# Patient Record
Sex: Female | Born: 1990 | Race: White | Hispanic: No | Marital: Married | Smoking: Current every day smoker
Health system: Southern US, Community
[De-identification: ages and names within clinical notes are randomized; demographics above are authoritative.]

## PROBLEM LIST (undated history)

## (undated) ENCOUNTER — Inpatient Hospital Stay (HOSPITAL_COMMUNITY): Payer: Self-pay

## (undated) DIAGNOSIS — Z9889 Other specified postprocedural states: Secondary | ICD-10-CM

## (undated) HISTORY — PX: DILATION AND CURETTAGE OF UTERUS: SHX78

---
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
## 2009-03-15 ENCOUNTER — Inpatient Hospital Stay (HOSPITAL_COMMUNITY): Admission: AD | Admit: 2009-03-15 | Discharge: 2009-03-15 | Payer: Self-pay | Admitting: Obstetrics and Gynecology

## 2009-07-04 ENCOUNTER — Inpatient Hospital Stay (HOSPITAL_COMMUNITY): Admission: AD | Admit: 2009-07-04 | Discharge: 2009-07-05 | Payer: Self-pay | Admitting: Obstetrics and Gynecology

## 2009-09-17 ENCOUNTER — Inpatient Hospital Stay (HOSPITAL_COMMUNITY): Admission: AD | Admit: 2009-09-17 | Discharge: 2009-09-17 | Payer: Self-pay | Admitting: Obstetrics and Gynecology

## 2009-09-23 ENCOUNTER — Inpatient Hospital Stay (HOSPITAL_COMMUNITY): Admission: AD | Admit: 2009-09-23 | Discharge: 2009-09-23 | Payer: Self-pay | Admitting: Obstetrics and Gynecology

## 2009-09-26 ENCOUNTER — Inpatient Hospital Stay (HOSPITAL_COMMUNITY): Admission: RE | Admit: 2009-09-26 | Discharge: 2009-09-27 | Payer: Self-pay | Admitting: Obstetrics and Gynecology

## 2010-03-18 ENCOUNTER — Emergency Department (HOSPITAL_COMMUNITY): Admission: EM | Admit: 2010-03-18 | Discharge: 2010-03-18 | Payer: Self-pay | Admitting: Emergency Medicine

## 2010-07-22 NOTE — L&D Delivery Note (Signed)
Delivery note: This patient progressed to 4 cm dilatation and artificial rupture of membranes produced clear fluid. She steadfastly refused an epidural anesthetic. Consequently, she felt the baby was coming out and called for somebody to come to her room to deliver the baby. The cervix was found to continue to be 4 cm dilated.subsequently, she progressed to 7 cm dilatation and requested an epidural. During her IV infusion she progressed to 9 cm and was unable to stop her pushing efforts. Before, an epidural could be given, she delivered a living female infant 7 lbs. 10 oz. Apgars 9 at 1 and 9 at 5 minutes. The placenta was removed intact after cord blood was obtained the uterus was inspected and found to be free of any remaining products of conception.  Bright red blood was seen on the perineum prior to delivery of the vertex. Inspection of the perineum postpartum showed the perineum to be intact but there were multiple interlabial and  a periurethral laceration. Some of the labial lacerations were bleeding and required suturing with 3-0 Vicryl under local 1% Xylocaine block. Estimated blood loss 400 cc. Mother and baby were stable at the end of the delivery.

## 2010-08-31 ENCOUNTER — Other Ambulatory Visit (HOSPITAL_COMMUNITY): Payer: Self-pay | Admitting: Obstetrics and Gynecology

## 2010-08-31 DIAGNOSIS — Z3689 Encounter for other specified antenatal screening: Secondary | ICD-10-CM

## 2010-08-31 LAB — TYPE AND SCREEN: Antibody Screen: NEGATIVE

## 2010-08-31 LAB — RUBELLA ANTIBODY, IGM: Rubella: IMMUNE

## 2010-08-31 LAB — GC/CHLAMYDIA PROBE AMP, GENITAL: Chlamydia: NEGATIVE

## 2010-08-31 LAB — ABO/RH: RH Type: NEGATIVE

## 2010-09-03 ENCOUNTER — Ambulatory Visit (HOSPITAL_COMMUNITY)
Admission: RE | Admit: 2010-09-03 | Discharge: 2010-09-03 | Disposition: A | Discharge status: Home / Self Care | Payer: Medicaid Other | Source: Ambulatory Visit | Attending: Obstetrics and Gynecology | Admitting: Obstetrics and Gynecology

## 2010-09-03 ENCOUNTER — Encounter (HOSPITAL_COMMUNITY): Payer: Self-pay

## 2010-09-03 DIAGNOSIS — O99330 Smoking (tobacco) complicating pregnancy, unspecified trimester: Secondary | ICD-10-CM | POA: Insufficient documentation

## 2010-09-03 DIAGNOSIS — Z363 Encounter for antenatal screening for malformations: Secondary | ICD-10-CM | POA: Insufficient documentation

## 2010-09-03 DIAGNOSIS — Z3689 Encounter for other specified antenatal screening: Secondary | ICD-10-CM

## 2010-09-03 DIAGNOSIS — Z8751 Personal history of pre-term labor: Secondary | ICD-10-CM | POA: Insufficient documentation

## 2010-09-03 DIAGNOSIS — Z1389 Encounter for screening for other disorder: Secondary | ICD-10-CM | POA: Insufficient documentation

## 2010-09-03 DIAGNOSIS — O358XX0 Maternal care for other (suspected) fetal abnormality and damage, not applicable or unspecified: Secondary | ICD-10-CM | POA: Insufficient documentation

## 2010-10-05 LAB — POCT PREGNANCY, URINE: Preg Test, Ur: NEGATIVE

## 2010-10-15 LAB — CBC
HCT: 41.2 % (ref 36.0–46.0)
Hemoglobin: 14 g/dL (ref 12.0–15.0)
MCHC: 34 g/dL (ref 30.0–36.0)
MCHC: 34.7 g/dL (ref 30.0–36.0)
MCV: 101.2 fL — ABNORMAL HIGH (ref 78.0–100.0)
MCV: 101.5 fL — ABNORMAL HIGH (ref 78.0–100.0)
Platelets: 177 10*3/uL (ref 150–400)
Platelets: 202 10*3/uL (ref 150–400)
RBC: 3.39 MIL/uL — ABNORMAL LOW (ref 3.87–5.11)
RDW: 12.7 % (ref 11.5–15.5)
RDW: 13.2 % (ref 11.5–15.5)

## 2010-10-15 LAB — WET PREP, GENITAL: Yeast Wet Prep HPF POC: NONE SEEN

## 2010-10-23 LAB — URINALYSIS, ROUTINE W REFLEX MICROSCOPIC
Glucose, UA: NEGATIVE mg/dL
Ketones, ur: NEGATIVE mg/dL
Nitrite: NEGATIVE
Protein, ur: NEGATIVE mg/dL
Urobilinogen, UA: 0.2 mg/dL (ref 0.0–1.0)

## 2010-10-23 LAB — URINE CULTURE
Colony Count: NO GROWTH
Culture: NO GROWTH

## 2010-10-23 LAB — URINE MICROSCOPIC-ADD ON

## 2010-10-27 LAB — RH IMMUNE GLOBULIN WORKUP (NOT WOMEN'S HOSP)
ABO/RH(D): A NEG
Antibody Screen: NEGATIVE

## 2010-11-05 LAB — TYPE AND SCREEN
ABO/RH(D): A NEG
Antibody Screen: POSITIVE
DAT, IgG: NEGATIVE

## 2010-11-05 LAB — URINALYSIS, ROUTINE W REFLEX MICROSCOPIC
Glucose, UA: NEGATIVE mg/dL
Glucose, UA: NEGATIVE mg/dL
Hgb urine dipstick: NEGATIVE
Ketones, ur: NEGATIVE mg/dL
Nitrite: NEGATIVE
Nitrite: NEGATIVE
Nitrite: NEGATIVE
Specific Gravity, Urine: >1.03 — ABNORMAL HIGH (ref 1.005–1.030)
Specific Gravity, Urine: 1.031 — ABNORMAL HIGH (ref 1.005–1.030)
Urobilinogen, UA: 0.2 mg/dL (ref 0.0–1.0)
pH: 5.5 (ref 5.0–8.0)
pH: 6.5 (ref 5.0–8.0)
pH: 5.5 (ref 5.0–8.0)

## 2010-11-05 LAB — DIFFERENTIAL
Basophils Absolute: 0.2 10*3/uL — ABNORMAL HIGH (ref 0.0–0.1)
Basophils Relative: 1 % (ref 0–1)
Eosinophils Absolute: 0 10*3/uL (ref 0.0–0.7)
Monocytes Relative: 4 % (ref 3–12)
Neutro Abs: 21.1 10*3/uL — ABNORMAL HIGH (ref 1.7–7.7)
Neutrophils Relative %: 90 % — ABNORMAL HIGH (ref 43–77)

## 2010-11-05 LAB — CBC
HCT: 26.1 % — ABNORMAL LOW (ref 36.0–46.0)
HCT: 37.3 % (ref 36.0–46.0)
Hemoglobin: 12.7 g/dL (ref 12.0–15.0)
MCHC: 34.1 g/dL (ref 30.0–36.0)
MCHC: 34.4 g/dL (ref 30.0–36.0)
MCHC: 34 g/dL (ref 30.0–36.0)
MCV: 100.5 fL — ABNORMAL HIGH (ref 78.0–100.0)
Platelets: 199 10*3/uL (ref 150–400)
Platelets: 221 10*3/uL (ref 150–400)
Platelets: 251 10*3/uL (ref 150–400)
RBC: 2.59 MIL/uL — ABNORMAL LOW (ref 3.87–5.11)
RBC: 3.93 MIL/uL (ref 3.87–5.11)
RDW: 12.9 % (ref 11.5–15.5)
RDW: 13 % (ref 11.5–15.5)
RDW: 13.1 % (ref 11.5–15.5)
WBC: 21.7 10*3/uL — ABNORMAL HIGH (ref 4.0–10.5)

## 2010-11-05 LAB — URINE MICROSCOPIC-ADD ON

## 2010-11-05 LAB — RAPID URINE DRUG SCREEN, HOSP PERFORMED
Amphetamines: NOT DETECTED
Barbiturates: NOT DETECTED
Cocaine: NOT DETECTED
Opiates: POSITIVE — AB

## 2010-11-05 LAB — WET PREP, GENITAL: Clue Cells Wet Prep HPF POC: NONE SEEN

## 2010-11-05 LAB — POCT PREGNANCY, URINE: Preg Test, Ur: POSITIVE

## 2010-11-05 LAB — RH IMMUNE GLOB WKUP(>/=20WKS)(NOT WOMEN'S HOSP)

## 2010-11-05 LAB — ABO/RH: ABO/RH(D): A NEG

## 2010-11-05 LAB — RUBELLA SCREEN: Rubella: 66 IU/mL — ABNORMAL HIGH

## 2010-11-05 LAB — SAMPLE TO BLOOD BANK

## 2010-11-05 LAB — POCT I-STAT, CHEM 8
Calcium, Ion: 1.13 mmol/L (ref 1.12–1.32)
Chloride: 109 mEq/L (ref 96–112)
Glucose, Bld: 96 mg/dL (ref 70–99)
HCT: 40 % (ref 36.0–46.0)
TCO2: 18 mmol/L (ref 0–100)

## 2010-11-28 LAB — STREP B DNA PROBE: GBS: NEGATIVE

## 2010-12-04 NOTE — Op Note (Signed)
[name], Jacqueline Odom NO.:  [id number]   MEDICAL RECORD NO.:  [id number]          PATIENT TYPE:  AMB   LOCATION:  SDC                           FACILITY:  [facility]   PHYSICIAN:  Gerald Leitz, MD          DATE OF BIRTH:  [birth year]   DATE OF PROCEDURE:  08/19/2008  DATE OF DISCHARGE:                               OPERATIVE REPORT   PREOPERATIVE DIAGNOSIS:  Retained products of conception.   POSTOPERATIVE DIAGNOSIS:  Retained products of conception.   PROCEDURE:  Dilation and curettage under ultrasound guidance.   SURGEON:  Gerald Leitz, MD   ASSISTANT:  None.   ANESTHESIA:  MAC.   FINDINGS:  Retained products of conception.   SPECIMEN:  Products of conception.   DISPOSITION:  Specimen to pathology.   ESTIMATED BLOOD LOSS:  400 mL.   COMPLICATIONS:  None.   PROCEDURE:  Informed consent was obtained.  The patient was taken to the  operating room, where she was placed under MAC anesthesia.  She was  placed in the dorsal lithotomy position and prepped and draped in the  usual sterile fashion.  The speculum was placed into the vaginal vault.  The anterior lip of the cervix was grasped with a single-tooth  tenaculum.  The uterus sounded to approximately 9.5 cm.  Ultrasound was  performed.  The patient was noted to have several areas of Doppler flow.  Once endometrium sharp curettage was performed, multiple products were  obtained.  However, gritty texture was noted.  At this point, a suction  curettage was performed using a 12-mm curette.  Significant amount of  products were removed via suction curettage.  Sharp curettage was  performed, action  suction, and then a gritty texture was noted at this  point all around.  The ultrasound did not demonstrate any remaining  products of conception.  The patient was noted to have some moderate  bleeding and a 1000 mcg of  Cytotec was placed into the rectum.  She was also given Methergine 0.2  mg IM and bleeding subsided.   All instruments were removed from the  vagina.  The patient was taken to the operating room, awake, and in  stable condition.  Sponge, lap, and needle counts were correct x2.      Gerald Leitz, MD  Electronically Signed     TC/MEDQ  D:  08/19/2008  T:  08/20/2008  Job:  [id number]

## 2010-12-07 NOTE — Discharge Summary (Signed)
Jacqueline Odom, Jacqueline Odom                ACCOUNT NO.:  [id number]   MEDICAL RECORD NO.:  [id number]          PATIENT TYPE:  INP   LOCATION:  9303                          FACILITY:  [facility]   PHYSICIAN:  Gerald Leitz, MD          DATE OF BIRTH:  07/26/1990   DATE OF ADMISSION:  08/16/2008  DATE OF DISCHARGE:  08/17/2008                               DISCHARGE SUMMARY   ADMISSION DIAGNOSES:  1. A 26-week intrauterine pregnancy.  2. Active labor.  3. Trichomonas  4. No prenatal care.   DISCHARGE DIAGNOSES:  1. A 26-week intrauterine pregnancy.  2. Active labor.  3. Trichomonas  4. No prenatal care.  5. Status post spontaneous vaginal delivery.  6. Postpartum hemorrhage.  7. Uterine atony.   BRIEF HOSPITAL COURSE:  The patient was seen at [facility] emergency  department complaining of abdominal pain and found to be pregnant.  She  was transferred to [facility] where I asked them to hear.  Upon  arrival, she was complaining of contractions, injury evaluation.  She  became completely dilated and delivered approximately 26 week estimated  gestational age infant in maternity admissions unit with an hydramnios  and brain abnormality that was incompatible with a live.  Neonatologist  came to delivery who was present shortly after delivery.  The patient's  placenta was delivered.  In returning admissions unit, she was noted to  have uterine atony.  She was taken to on labor and delivery room where  she received 1000 mcg of Cytotec per rectum, Methergine, 40 units of  Pitocin, and uterine massage, bleeding decreased significantly.  Her  hemoglobin on admission was 12.7 and on discharge after delivery was  9.1.  She was discharged home on Flagyl for Trichomonas as well as  Methergine.  She was to follow up in my office in 2 week.   Condition at discharge stable and improved.      Gerald Leitz, MD  Electronically Signed     TC/MEDQ  D:  09/27/2008  T:  09/28/2008  Job:  [id number]

## 2011-01-28 ENCOUNTER — Inpatient Hospital Stay (HOSPITAL_COMMUNITY)
Admission: AD | Admit: 2011-01-28 | Discharge: 2011-01-30 | DRG: 775 | Disposition: A | Discharge status: Home / Self Care | Payer: Medicaid Other | Source: Ambulatory Visit | Attending: Obstetrics and Gynecology | Admitting: Obstetrics and Gynecology

## 2011-01-28 ENCOUNTER — Other Ambulatory Visit: Payer: Self-pay | Admitting: Obstetrics and Gynecology

## 2011-01-28 ENCOUNTER — Encounter (HOSPITAL_COMMUNITY): Payer: Self-pay | Admitting: *Deleted

## 2011-01-28 DIAGNOSIS — O365990 Maternal care for other known or suspected poor fetal growth, unspecified trimester, not applicable or unspecified: Secondary | ICD-10-CM | POA: Diagnosis present

## 2011-01-28 DIAGNOSIS — Z9889 Other specified postprocedural states: Secondary | ICD-10-CM

## 2011-01-28 HISTORY — DX: Other specified postprocedural states: Z98.890

## 2011-01-28 LAB — CBC
HCT: 38.5 % (ref 36.0–46.0)
MCV: 95.1 fL (ref 78.0–100.0)
Platelets: 218 10*3/uL (ref 150–400)
RBC: 4.05 MIL/uL (ref 3.87–5.11)
WBC: 11.4 10*3/uL — ABNORMAL HIGH (ref 4.0–10.5)

## 2011-01-28 MED ORDER — OXYTOCIN 20 UNITS IN LACTATED RINGERS INFUSION - SIMPLE
1.0000 m[IU]/min | INTRAVENOUS | Status: DC
  Administered 2011-01-28: 11 m[IU]/min via INTRAVENOUS
  Administered 2011-01-28: 0.333 m[IU]/min via INTRAVENOUS

## 2011-01-28 MED ORDER — LACTATED RINGERS IV SOLN: 500.0000 mL | Freq: Once | INTRAVENOUS | Status: DC

## 2011-01-28 MED ORDER — PHENYLEPHRINE 40 MCG/ML (10ML) SYRINGE FOR IV PUSH (FOR BLOOD PRESSURE SUPPORT)
PREFILLED_SYRINGE | INTRAVENOUS | Status: AC
  Filled 2011-01-28: qty 5

## 2011-01-28 MED ORDER — PHENYLEPHRINE 40 MCG/ML (10ML) SYRINGE FOR IV PUSH (FOR BLOOD PRESSURE SUPPORT)
80.0000 ug | PREFILLED_SYRINGE | INTRAVENOUS | Status: DC | PRN
  Filled 2011-01-28: qty 5

## 2011-01-28 MED ORDER — LACTATED RINGERS IV SOLN: INTRAVENOUS | Status: DC

## 2011-01-28 MED ORDER — EPHEDRINE 5 MG/ML INJ
INTRAVENOUS | Status: AC
  Filled 2011-01-28: qty 4

## 2011-01-28 MED ORDER — EPHEDRINE 5 MG/ML INJ
10.0000 mg | INTRAVENOUS | Status: DC | PRN
  Filled 2011-01-28: qty 4

## 2011-01-28 MED ORDER — FLEET ENEMA 7-19 GM/118ML RE ENEM: 1.0000 | ENEMA | RECTAL | Status: DC | PRN

## 2011-01-28 MED ORDER — OXYTOCIN 20 UNITS IN LACTATED RINGERS INFUSION - SIMPLE
125.0000 mL/h | Freq: Once | INTRAVENOUS | Status: DC
  Filled 2011-01-28 (×2): qty 1000

## 2011-01-28 MED ORDER — LACTATED RINGERS IV SOLN
500.0000 mL | Freq: Once | INTRAVENOUS | Status: AC | PRN
Start: 2011-01-28 — End: 2011-01-28

## 2011-01-28 MED ORDER — ONDANSETRON HCL 4 MG/2ML IJ SOLN: 4.0000 mg | Freq: Four times a day (QID) | INTRAMUSCULAR | Status: DC | PRN

## 2011-01-28 MED ORDER — ACETAMINOPHEN 325 MG PO TABS: 650.0000 mg | ORAL_TABLET | ORAL | Status: DC | PRN

## 2011-01-28 MED ORDER — TERBUTALINE SULFATE 1 MG/ML IJ SOLN: 0.2500 mg | Freq: Once | INTRAMUSCULAR | Status: AC | PRN

## 2011-01-28 MED ORDER — FENTANYL 2.5 MCG/ML BUPIVACAINE 1/10 % EPIDURAL INFUSION (WH - ANES): 2.0000 mL/h | INTRAMUSCULAR | Status: DC

## 2011-01-28 MED ORDER — IBUPROFEN 600 MG PO TABS
600.0000 mg | ORAL_TABLET | Freq: Four times a day (QID) | ORAL | Status: DC | PRN
  Administered 2011-01-29: 600 mg via ORAL
  Filled 2011-01-28 (×2): qty 1

## 2011-01-28 MED ORDER — FENTANYL 2.5 MCG/ML BUPIVACAINE 1/10 % EPIDURAL INFUSION (WH - ANES)
INTRAMUSCULAR | Status: AC
  Filled 2011-01-28: qty 60

## 2011-01-28 MED ORDER — DIPHENHYDRAMINE HCL 50 MG/ML IJ SOLN: 12.5000 mg | INTRAMUSCULAR | Status: DC | PRN

## 2011-01-28 MED ORDER — TERBUTALINE SULFATE 1 MG/ML IJ SOLN: 0.2500 mg | Freq: Once | INTRAMUSCULAR | Status: DC | PRN

## 2011-01-28 MED ORDER — CITRIC ACID-SODIUM CITRATE 334-500 MG/5ML PO SOLN: 30.0000 mL | ORAL | Status: DC | PRN

## 2011-01-28 MED ORDER — LIDOCAINE HCL (PF) 1 % IJ SOLN
30.0000 mL | Freq: Once | INTRAMUSCULAR | Status: AC | PRN
  Administered 2011-01-28: 30 mL via SUBCUTANEOUS
  Filled 2011-01-28: qty 30

## 2011-01-28 NOTE — Progress Notes (Signed)
Jacqueline Odom is a 20 y.o. G3P1101 at [redacted]w[redacted]d by ultrasound admitted for induction of labor due to Elective at term.  Subjective:   Objective: BP 154/73  Pulse 78  Temp(Src) 98.5 F (36.9 C) (Oral)  Resp 18  Ht 5\' 5"  (1.651 m)  Wt 77.111 kg (170 lb)  BMI 28.29 kg/m2  LMP 04/22/2010  Breastfeeding? Unknown      FHT:  FHR: 140 bpm, variability: moderate,  accelerations:  Present,  decelerations:  Absent UC:   regular, every 3 minutes SVE:   Dilation: 4 Effacement (%): 70 Station: -2 Exam by:: Kagan Mutchler AROM clear fluid at approximately 6:30 PM  Labs: Lab Results  Component Value Date   WBC 11.4* 01/28/2011   HGB 13.5 01/28/2011   HCT 38.5 01/28/2011   MCV 95.1 01/28/2011   PLT 218 01/28/2011    Assessment / Plan: Induction of labor due to term with favorable cervix,  progressing well on pitocin  Labor: Progressing normally Preeclampsia:  no signs or symptoms of toxicity Fetal Wellbeing:  Category I Pain Control:  Labor support without medications I/D:  n/a Anticipated MOD:  NSVD  Dashan Chizmar F 01/28/2011, 7:15 PM

## 2011-01-28 NOTE — H&P (Signed)
History and physical exam on Jacqueline Odom. This is a 20 year old white female para 79 gravida 3 EDC 02/03/2011 based on an ultrasound at 10 weeks 3 days on 07/11/2010 who is admitted for induction of labor because of 39+ weeks gestation and possible small for gestational age infant . Laboratory data blood group and type A- negative antibody rubella immune RPR nonreactive urine culture no growth hepatitis B surface antigen negative HIV negative GC and Chlamydia negative quad screen negative one-hour Glucola 90 1 repeat HIV and RPR nonreactive group B strep negative RhoGAM given at 28 weeks  Vaginal ultrasound 06/27/2010 average gestational age [redacted] weeks 2 days [facility] 02/04/2011 followup ultrasound showed normal anatomy the patient was placed on bowel loop in 250 mg IM weekly after her first prenatal visit at 17 weeks prenatal course was unremarkable except the patient did smoke one to 2 cigarettes a day and she was begun on nonstress test 01/15/2011 because of possible small for dates baby and her smoking history the results of the ultrasound are not available.  Past medical history: Allergies: Codeine sulfate unsure of the type of reaction penicillin unsure of the type of reaction  Illnesses asthma vaginal trichomoniasis  Operations: D&C 2010  Family history: Sister: glaucoma, and brother : in hypertension . Maternal grandfather coronary artery disease great and diabetes  Obstetric history: 07/22/2008 vaginal delivery 1 pounds 3 and infant with retained placenta requiring a D&C the baby died shortly after her  10/25/2009 7 lbs. 0 oz. Female infant at 29 weeks, precipitous delivery  Social history: Denies current alcohol use but has a history of alcoholism denies illicit substance abuse. Currently uses tobacco probably no more than one half pack per day.  Physical exam: Blood pressure 141/64 temperature 98.6, pulse 70, respirations 18 Well-developed well-nourished white female in no distress Head eyes  nose and throat normal Lungs: Clear to a. Heart: Normal sinus rhythm, no murmurs. Abdomen: Fundal height on 01/15/2011 34 cm. Fetal heart tones normal, no decelerations, good accelerations. Cervix: 3 cm 40% effaced vertex -3 station  Impression: Intrauterine pregnancy 39 weeks possible SGA infant.  Plan: Begin IV Pitocin and observe for progress.

## 2011-01-28 NOTE — Plan of Care (Signed)
Problem: Consults Goal: Birthing Suites Patient Information Press F2 to bring up selections list  Outcome: Completed/Met Date Met:  01/28/11  Pt 37-[redacted] weeks EGA     

## 2011-01-29 LAB — CBC
MCH: 33 pg (ref 26.0–34.0)
MCV: 95.8 fL (ref 78.0–100.0)
Platelets: 201 10*3/uL (ref 150–400)
RBC: 3.85 MIL/uL — ABNORMAL LOW (ref 3.87–5.11)
RDW: 13.3 % (ref 11.5–15.5)
WBC: 14.1 10*3/uL — ABNORMAL HIGH (ref 4.0–10.5)

## 2011-01-29 LAB — DIFFERENTIAL
Basophils Absolute: 0 10*3/uL (ref 0.0–0.1)
Lymphs Abs: 2 10*3/uL (ref 0.7–4.0)
Monocytes Absolute: 1.3 10*3/uL — ABNORMAL HIGH (ref 0.1–1.0)
Monocytes Relative: 9 % (ref 3–12)
Neutrophils Relative %: 76 % (ref 43–77)

## 2011-01-29 MED ORDER — IBUPROFEN 600 MG PO TABS
600.0000 mg | ORAL_TABLET | Freq: Four times a day (QID) | ORAL | Status: DC
  Administered 2011-01-29 – 2011-01-30 (×4): 600 mg via ORAL
  Filled 2011-01-29 (×4): qty 1

## 2011-01-29 MED ORDER — OXYTOCIN 20 UNITS IN LACTATED RINGERS INFUSION - SIMPLE: 125.0000 mL/h | INTRAVENOUS | Status: DC

## 2011-01-29 MED ORDER — OXYCODONE-ACETAMINOPHEN 5-325 MG PO TABS
1.0000 | ORAL_TABLET | ORAL | Status: DC | PRN
  Administered 2011-01-29 – 2011-01-30 (×3): 1 via ORAL
  Filled 2011-01-29 (×3): qty 1

## 2011-01-29 MED ORDER — RHO D IMMUNE GLOBULIN 1500 UNIT/2ML IJ SOLN
300.0000 ug | Freq: Once | INTRAMUSCULAR | Status: AC
  Administered 2011-01-29: 300 ug via INTRAMUSCULAR

## 2011-01-29 MED ORDER — PRENATAL PLUS 27-1 MG PO TABS
1.0000 | ORAL_TABLET | Freq: Every day | ORAL | Status: DC
  Administered 2011-01-30: 1 via ORAL
  Filled 2011-01-29: qty 1

## 2011-01-29 MED ORDER — LANOLIN HYDROUS EX OINT: TOPICAL_OINTMENT | CUTANEOUS | Status: DC | PRN

## 2011-01-29 MED ORDER — DIPHENHYDRAMINE HCL 25 MG PO CAPS: 25.0000 mg | ORAL_CAPSULE | Freq: Four times a day (QID) | ORAL | Status: DC | PRN

## 2011-01-29 MED ORDER — SENNOSIDES-DOCUSATE SODIUM 8.6-50 MG PO TABS
1.0000 | ORAL_TABLET | Freq: Every day | ORAL | Status: DC
  Administered 2011-01-29: 1 via ORAL

## 2011-01-29 MED ORDER — PRENATAL PLUS 27-1 MG PO TABS: 1.0000 | ORAL_TABLET | Freq: Every day | ORAL | Status: DC

## 2011-01-29 MED ORDER — SIMETHICONE 80 MG PO CHEW: 80.0000 mg | CHEWABLE_TABLET | ORAL | Status: DC | PRN

## 2011-01-29 MED ORDER — ZOLPIDEM TARTRATE 5 MG PO TABS: 5.0000 mg | ORAL_TABLET | Freq: Every evening | ORAL | Status: DC | PRN

## 2011-01-29 MED ORDER — RHO D IMMUNE GLOBULIN 1500 UNIT/2ML IJ SOLN: 300.0000 ug | Freq: Once | INTRAMUSCULAR | Status: AC

## 2011-01-29 MED ORDER — WITCH HAZEL-GLYCERIN EX PADS: MEDICATED_PAD | CUTANEOUS | Status: DC | PRN

## 2011-01-29 MED ORDER — ONDANSETRON HCL 4 MG PO TABS: 4.0000 mg | ORAL_TABLET | ORAL | Status: DC | PRN

## 2011-01-29 MED ORDER — BENZOCAINE-MENTHOL 20-0.5 % EX AERO: 1.0000 "application " | INHALATION_SPRAY | CUTANEOUS | Status: DC | PRN

## 2011-01-29 MED ORDER — ONDANSETRON HCL 4 MG/2ML IJ SOLN: 4.0000 mg | INTRAMUSCULAR | Status: DC | PRN

## 2011-01-29 MED ORDER — TETANUS-DIPHTH-ACELL PERTUSSIS 5-2.5-18.5 LF-MCG/0.5 IM SUSP
0.5000 mL | Freq: Once | INTRAMUSCULAR | Status: AC
  Administered 2011-01-30: 0.5 mL via INTRAMUSCULAR
  Filled 2011-01-29: qty 0.5

## 2011-01-29 MED ORDER — MEASLES, MUMPS & RUBELLA VAC ~~LOC~~ INJ
0.5000 mL | INJECTION | Freq: Once | SUBCUTANEOUS | Status: DC
  Filled 2011-01-29: qty 0.5

## 2011-01-29 NOTE — Progress Notes (Signed)
  PP#1 Afebrile,BP normal, no complaints.

## 2011-01-29 NOTE — Progress Notes (Signed)
UR Chart Review completed.  

## 2011-01-30 ENCOUNTER — Encounter (HOSPITAL_COMMUNITY): Payer: Self-pay | Admitting: Obstetrics and Gynecology

## 2011-01-30 DIAGNOSIS — Z9889 Other specified postprocedural states: Secondary | ICD-10-CM

## 2011-01-30 HISTORY — DX: Other specified postprocedural states: Z98.890

## 2011-01-30 LAB — RH IG WORKUP (INCLUDES ABO/RH)
Gestational Age(Wks): 40
Unit division: 0

## 2011-01-30 MED ORDER — LANOLIN HYDROUS EX OINT: 1.0000 "application " | TOPICAL_OINTMENT | CUTANEOUS | Status: DC | PRN

## 2011-01-30 MED ORDER — IBUPROFEN 600 MG PO TABS: 600.0000 mg | ORAL_TABLET | Freq: Four times a day (QID) | ORAL | Status: AC

## 2011-01-30 MED ORDER — BENZOCAINE-MENTHOL 20-0.5 % EX AERO: 1.0000 "application " | INHALATION_SPRAY | CUTANEOUS | Status: DC | PRN

## 2011-01-30 NOTE — Progress Notes (Signed)
  Afebrile, no problems for discharge.

## 2011-01-30 NOTE — Discharge Summary (Signed)
  Obstetric Discharge Summary Reason for Admission: induction of labor Intrapartum Procedures: spontaneous vaginal delivery Postpartum Procedures: none and Rho(D) Ig Complications-Operative and Postpartum: labial and periurethral laceration  Hemoglobin  Date Value Range Status  01/29/2011 12.7  12.0-15.0 (g/dL) Final     HCT  Date Value Range Status  01/29/2011 36.9  36.0-46.0 (%) Final    Discharge Diagnoses: Term Pregnancy-delivered  Discharge Information: Date: 01/30/2011 Activity: pelvic rest Diet: routine Medications: motrin 600 mg # 30  1 po q 6h prn pain  Jacqueline Odom, Jacqueline Odom  Home Medication Instructions [id number]   Printed on:01/30/11 0837  Medication Information                    prenatal vitamin w/FE, FA (PRENATAL 1 + 1) 27-1 MG TABS Take 1 tablet by mouth daily.               Condition: stable Instructions: refer to practice specific booklet Discharge to: home Follow-up Information    Follow up with Marco Raper F. Make an appointment in 6 weeks.   Contact information:   [employer] [address] [place], [address] [place] [address] [phone]          Newborn Data: Live born  Information for the patient's newborn:  KAMRI, GOTSCH [[id number]]  female  Home with mother.  Rheba Diamond F 01/30/2011, 8:37 AM

## 2011-05-12 IMAGING — US US OB DETAIL+14 WK
2 series · 12 of 28 positions shown · non-contrast
Comparison: none

[Series 1: us ob detail +14 wk · 10 of 66 slices shown (1 of 2)]
[im 3/66]
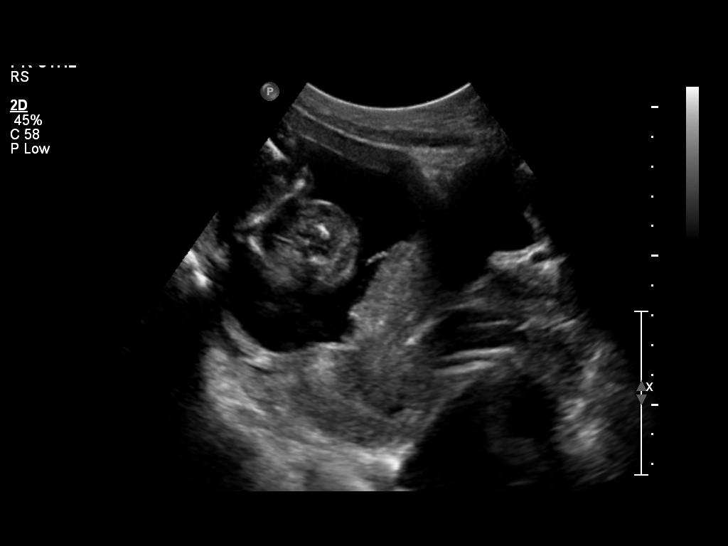
[im 9/66]
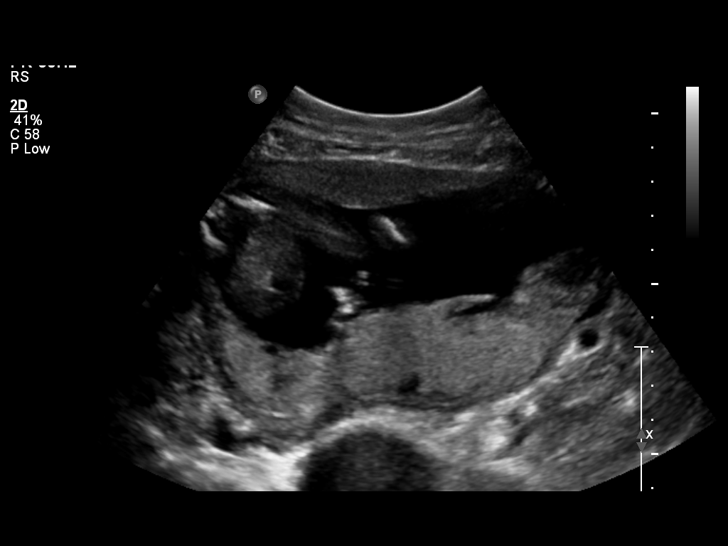
[im 15/66]
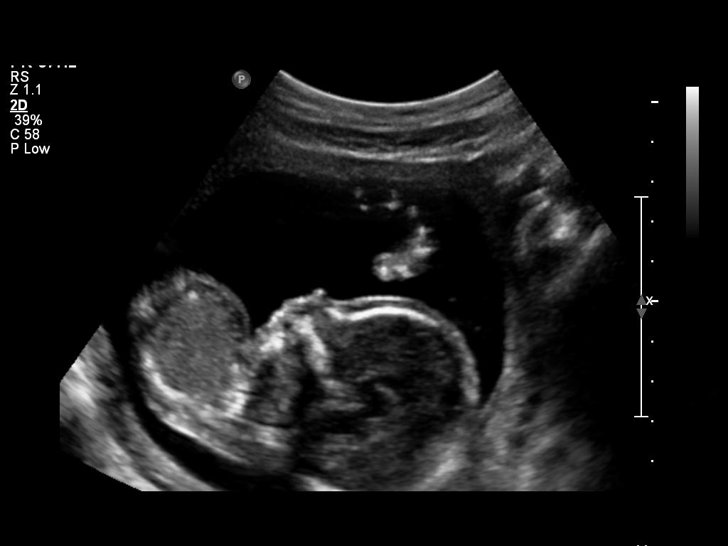
[im 23/66]
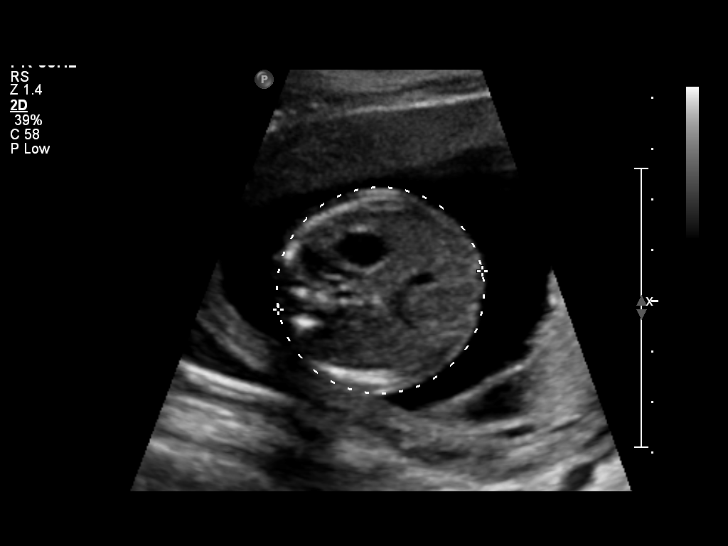
[im 29/66]
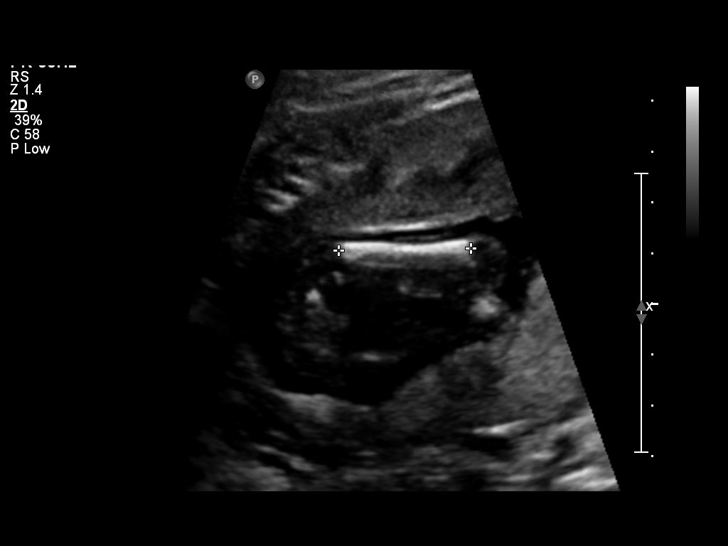
[im 34/66]
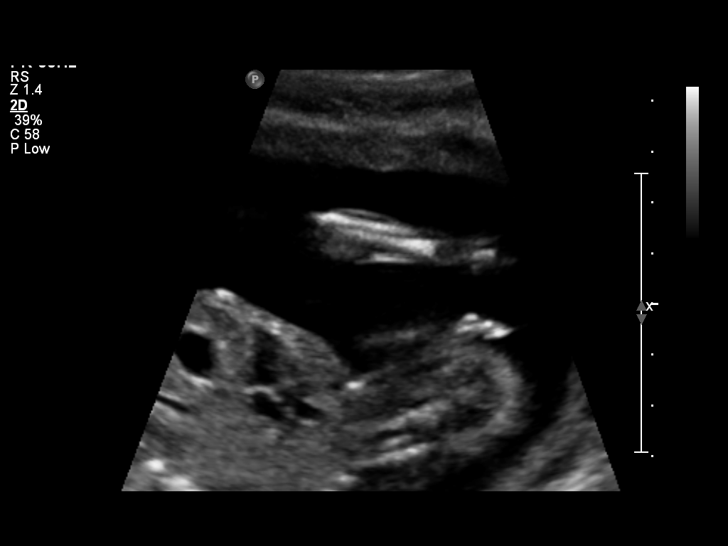
[im 43/66]
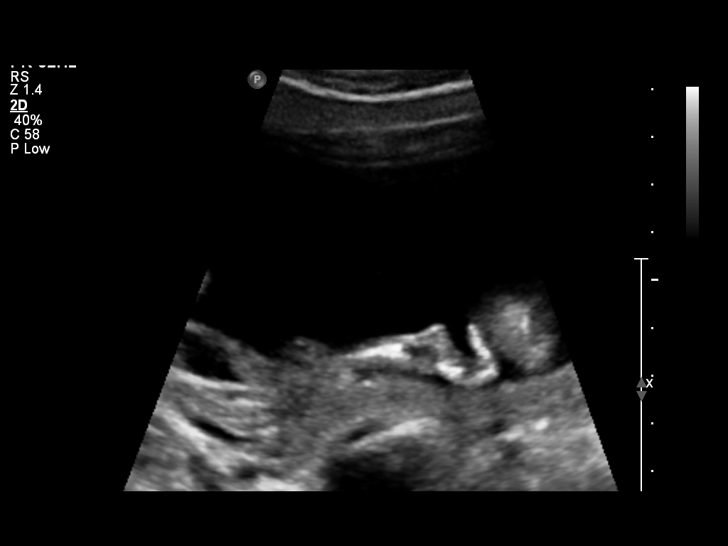
[im 49/66]
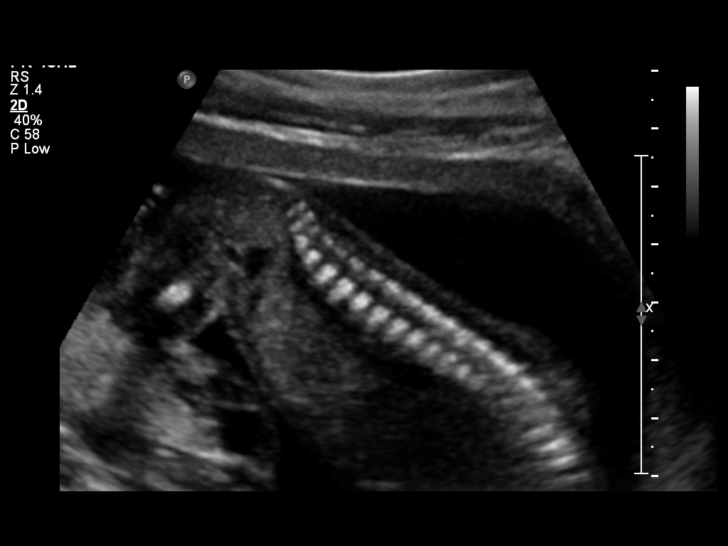
[im 54/66]
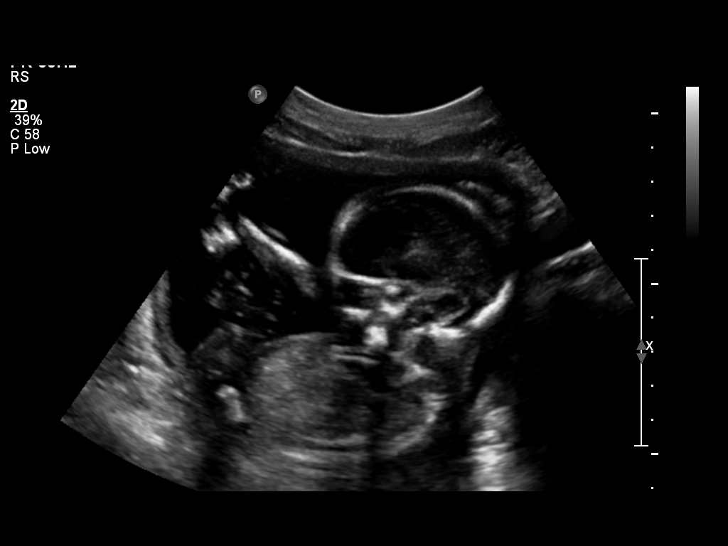
[im 63/66]
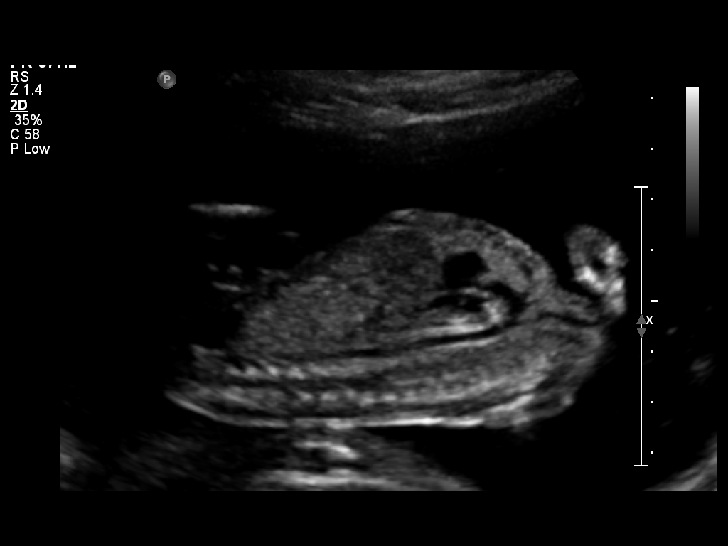

[Series 1: us ob detail +14 wk · 2 of 12 slices shown (2 of 2)]
[im 1/12]
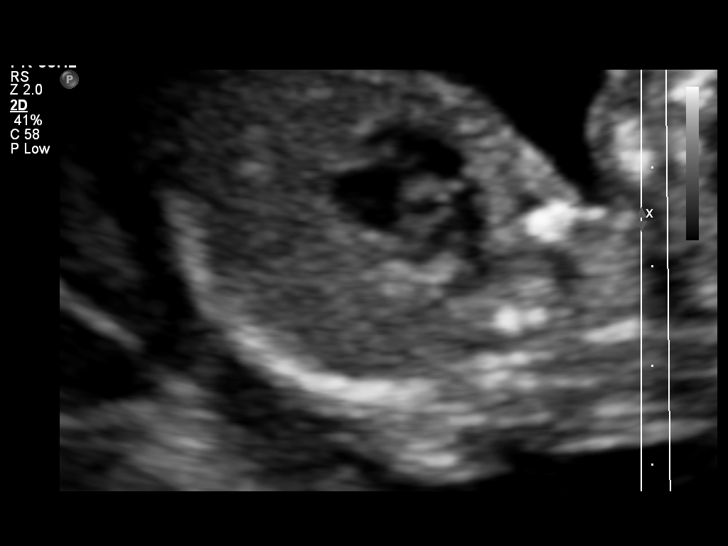
[im 8/12]
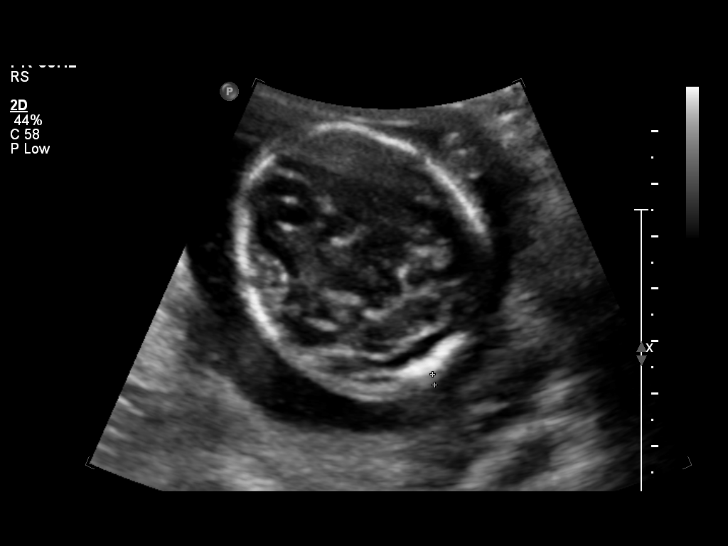

[12 of 28 positions shown; findings below may reference images not displayed]

OBSTETRICS REPORT
                      (Signed Final 09/03/2010 [DATE])

 Order#:         [id number]
Procedures

 US OB DETAIL + 14 WK                                  76811.0
Indications

 Poor obstetric history: Previous preterm delivery
 Detailed fetal anatomic survey
 Cigarette smoker
Fetal Evaluation

 Fetal Heart Rate:  132                          bpm
 Cardiac Activity:  Observed
 Presentation:      Variable
 Placenta:          Posterior, above cervical
                    os

 Amniotic Fluid
 AFI FV:      Subjectively within normal limits
                                             Larg Pckt:     6.9  cm
Biometry

 BPD:     43.4  mm     G. Age:  19w 1d                CI:         79.6   70 - 86
 OFD:     54.5  mm                                    FL/HC:      16.3   15.8 -
                                                                         18
 HC:     161.5  mm     G. Age:  19w 0d       79  %    HC/AC:      1.27   1.07 -

 AC:     126.8  mm     G. Age:  18w 2d       52  %    FL/BPD:
 FL:      26.4  mm     G. Age:  18w 0d       41  %    FL/AC:      20.8   20 - 24
 NFT:      2.1  mm

 Est. FW:     231  gm      0 lb 8 oz     51  %
Gestational Age

 Clinical EDD:  18w 1d                                        EDD:   02/03/11
 U/S Today:     18w 4d                                        EDD:   01/31/11
 Best:          18w 1d     Det. By:  Clinical EDD             EDD:   02/03/11
Anatomy

 Cranium:           Appears normal      Aortic Arch:       Appears normal
 Fetal Cavum:       Appears normal      Ductal Arch:       Appears normal
 Ventricles:        Appears normal      Diaphragm:         Appears normal
 Choroid Plexus:    Appears normal      Stomach:           Appears
                                                           normal, left
                                                           sided
 Cerebellum:        Appears normal      Abdomen:           Appears normal
 Posterior Fossa:   Appears normal      Abdominal Wall:    Appears nml
                                                           (cord insert,
                                                           abd wall)
 Nuchal Fold:       Appears normal      Cord Vessels:      Appears normal
                    (neck, nuchal                          (3 vessel cord)
                    fold)
 Face:              Appears normal      Kidneys:           Appear normal
                    (lips/profile/orbit
                    s)
 Heart:             Appears normal      Bladder:           Appears normal
                    (4 chamber &
                    axis)
 RVOT:              Appears normal      Spine:             Not well
                                                           visualized
 LVOT:              Appears normal      Limbs:             Four extremities
                                                           seen

 Other:     Fetus appears to be a male. Left heel visualized.
Cervix Uterus Adnexa

 Cervical Length:    3.4      cm

 Cervix:       Normal appearance by transabdominal scan.
 Left Ovary:    Not visualized.
 Right Ovary:   Not visualized.

 Adnexa:     No abnormality visualized.
Impression

 Siup demonstrating an EGA by ultrasound of 18w 4d. This
 corresponds well with expected EGA by clinical EDD of 18w
 1d.

 The fetal spine could not be assessed due to position. No
 other focal fetal or placental abnormalities are noted with a
 good anatomic evaluation otherwise possible. No soft
 markers for Down Syndrome are seen. Given the incomplete
 anatomic evaluation, sonographic modification of Down
 Syndrome can not be altered from that based on expected
 age at delivery of [DATE]. Correlation with other aneuploidy
 screening results, if available, would be recommended for a
 more complete risk assessment. Given the normal
 intracranial anatomy, if AFP value is normal follow up to
 assess the spine is likely to be of limited clinical utility,
 although this can be performed if desired.

 Subjectively and quantitatively normal amniotic fluid volume.
 Normal cervical length.

 questions or concerns.

## 2011-08-20 ENCOUNTER — Encounter (HOSPITAL_COMMUNITY): Payer: Self-pay | Admitting: *Deleted

## 2011-08-20 ENCOUNTER — Emergency Department (HOSPITAL_COMMUNITY)
Admission: EM | Admit: 2011-08-20 | Discharge: 2011-08-20 | Disposition: A | Discharge status: Home / Self Care | Payer: Self-pay | Attending: Emergency Medicine | Admitting: Emergency Medicine

## 2011-08-20 DIAGNOSIS — R109 Unspecified abdominal pain: Secondary | ICD-10-CM | POA: Insufficient documentation

## 2011-08-20 DIAGNOSIS — T148XXA Other injury of unspecified body region, initial encounter: Secondary | ICD-10-CM

## 2011-08-20 DIAGNOSIS — J45909 Unspecified asthma, uncomplicated: Secondary | ICD-10-CM | POA: Insufficient documentation

## 2011-08-20 DIAGNOSIS — Y9269 Other specified industrial and construction area as the place of occurrence of the external cause: Secondary | ICD-10-CM | POA: Insufficient documentation

## 2011-08-20 DIAGNOSIS — F17200 Nicotine dependence, unspecified, uncomplicated: Secondary | ICD-10-CM | POA: Insufficient documentation

## 2011-08-20 DIAGNOSIS — X500XXA Overexertion from strenuous movement or load, initial encounter: Secondary | ICD-10-CM | POA: Insufficient documentation

## 2011-08-20 LAB — URINALYSIS, ROUTINE W REFLEX MICROSCOPIC
Leukocytes, UA: NEGATIVE
Nitrite: NEGATIVE
Specific Gravity, Urine: >1.03 — ABNORMAL HIGH (ref 1.005–1.030)
pH: 5.5 (ref 5.0–8.0)

## 2011-08-20 LAB — URINE MICROSCOPIC-ADD ON

## 2011-08-20 LAB — POCT PREGNANCY, URINE: Preg Test, Ur: NEGATIVE

## 2011-08-20 MED ORDER — HYDROCODONE-ACETAMINOPHEN 5-325 MG PO TABS: 1.0000 | ORAL_TABLET | ORAL | Status: AC | PRN

## 2011-08-20 MED ORDER — IBUPROFEN 800 MG PO TABS: 800.0000 mg | ORAL_TABLET | Freq: Three times a day (TID) | ORAL | Status: AC

## 2011-08-20 MED ORDER — HYDROCODONE-ACETAMINOPHEN 5-325 MG PO TABS
1.0000 | ORAL_TABLET | Freq: Once | ORAL | Status: AC
  Administered 2011-08-20: 1 via ORAL
  Filled 2011-08-20: qty 1

## 2011-08-20 MED ORDER — IBUPROFEN 800 MG PO TABS
800.0000 mg | ORAL_TABLET | Freq: Once | ORAL | Status: AC
  Administered 2011-08-20: 800 mg via ORAL
  Filled 2011-08-20: qty 1

## 2011-08-20 NOTE — ED Notes (Signed)
Pt reporting some improvement in pain level.  Rating pain about 5 out of 10.

## 2011-08-20 NOTE — ED Notes (Signed)
C/o right flank pain x 2 hours onset while working. Denies n/v/d

## 2011-08-20 NOTE — ED Provider Notes (Signed)
History     CSN: [id number]  Arrival date & time 08/20/11  0131   First MD Initiated Contact with Patient 08/20/11 0148      Chief Complaint  Patient presents with  . Flank Pain    left    (Consider location/radiation/quality/duration/timing/severity/associated sxs/prior treatment) HPI Jacqueline Odom is a 21 y.o. 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 Pain became progressively worse as she worked. She has taken no medication.  PCP Dr. Ambrose Mantle  Past Medical History  Diagnosis Date  . Asthma   . Status post induction of labor 01/30/2011    History reviewed. No pertinent past surgical history.  No family history on file.  History  Substance Use Topics  . Smoking status: Current Everyday Smoker -- 0.5 packs/day    Types: Cigarettes  . Smokeless tobacco: Not on file  . Alcohol Use: No    OB History    Grav Para Term Preterm Abortions TAB SAB Ect Mult Living   4 3 2 1      2       Review of Systems 10 Systems reviewed and are negative for acute change except as noted in the HPI. Allergies  Codeine; Penicillins; and Sulfa drugs cross reactors  Home Medications   Current Outpatient Rx  Name Route Sig Dispense Refill  . BENZOCAINE-MENTHOL 20-0.5 % EX AERO Topical Apply 1 application topically as needed (perineal discomfort).    Marland Kitchen LANOLIN HYDROUS EX OINT Topical Apply 1 application topically as needed (for breast care).    Marland Kitchen PRENATAL PLUS 27-1 MG PO TABS Oral Take 1 tablet by mouth daily.        BP 121/78  Pulse 85  Temp 98.1 F (36.7 C)  Resp 18  Ht 5\' 6"  (1.676 m)  Wt 140 lb (63.504 kg)  BMI 22.60 kg/m2  SpO2 98%  LMP 07/28/2011  Physical Exam  Nursing note and vitals reviewed. Constitutional: She is oriented to person, place, and time. She appears well-developed and well-nourished. No distress.  HENT:  Head: Normocephalic and  atraumatic.  Right Ear: External ear normal.  Left Ear: External ear normal.  Mouth/Throat: Oropharynx is clear and moist.  Eyes: EOM are normal. Pupils are equal, round, and reactive to light.  Neck: Normal range of motion. Neck supple.  Cardiovascular: Normal rate, normal heart sounds and intact distal pulses.   Pulmonary/Chest: Effort normal and breath sounds normal.  Abdominal: Soft. Bowel sounds are normal.  Genitourinary:       No cva tenderness  Musculoskeletal:       focal discomfort to the left side with palpation. Patient is able to bend over toward her toes. Lateral bending to the left is uncomfortable.  Neurological: She is alert and oriented to person, place, and time. She has normal reflexes.  Skin: Skin is warm and dry.    ED Course  Procedures (including critical care time)  Results for orders placed during the hospital encounter of 08/20/11  URINALYSIS, ROUTINE W REFLEX MICROSCOPIC      Component Value Range   Color, Urine ORANGE (*) YELLOW    APPearance CLEAR  CLEAR    Specific Gravity, Urine >1.030 (*) 1.005 - 1.030    pH 5.5  5.0 - 8.0    Glucose, UA NEGATIVE  NEGATIVE (mg/dL)   Hgb urine dipstick NEGATIVE  NEGATIVE    Bilirubin Urine SMALL (*)  NEGATIVE    Ketones, ur NEGATIVE  NEGATIVE (mg/dL)   Protein, ur TRACE (*) NEGATIVE (mg/dL)   Urobilinogen, UA 1.0  0.0 - 1.0 (mg/dL)   Nitrite NEGATIVE  NEGATIVE    Leukocytes, UA NEGATIVE  NEGATIVE   POCT PREGNANCY, URINE      Component Value Range   Preg Test, Ur NEGATIVE  NEGATIVE   URINE MICROSCOPIC-ADD ON      Component Value Range   Squamous Epithelial / LPF FEW (*) RARE    WBC, UA 3-6  <3 (WBC/hpf)   RBC / HPF 3-6  <3 (RBC/hpf)   Bacteria, UA MANY (*) RARE      MDM  Patient with left sided pain that developed 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 UA is unremarkable. Given antiinflammatory and analgesic with improvement.Pt stable in ED with no significant deterioration in condition.The  patient appears reasonably screened and/or stabilized for discharge and I doubt any other medical condition or other [employer] requiring further screening, evaluation, or treatment in the ED at this time prior to discharge.  MDM Reviewed: nursing note and vitals Interpretation: labs           Nicoletta Dress. Colon Branch, MD 08/20/11 [id number]

## 2011-08-20 NOTE — ED Notes (Signed)
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 I felt something pop and now have pain all in the left side."

## 2012-03-16 ENCOUNTER — Other Ambulatory Visit (HOSPITAL_COMMUNITY): Payer: Self-pay | Admitting: Obstetrics and Gynecology

## 2012-03-16 DIAGNOSIS — Z0489 Encounter for examination and observation for other specified reasons: Secondary | ICD-10-CM

## 2012-03-18 ENCOUNTER — Encounter (HOSPITAL_COMMUNITY): Payer: Self-pay

## 2012-03-18 ENCOUNTER — Ambulatory Visit (HOSPITAL_COMMUNITY)
Admission: RE | Admit: 2012-03-18 | Discharge: 2012-03-18 | Disposition: A | Discharge status: Home / Self Care | Payer: Medicaid Other | Source: Ambulatory Visit | Attending: Obstetrics and Gynecology | Admitting: Obstetrics and Gynecology

## 2012-03-18 DIAGNOSIS — O99330 Smoking (tobacco) complicating pregnancy, unspecified trimester: Secondary | ICD-10-CM | POA: Insufficient documentation

## 2012-03-18 DIAGNOSIS — Z0489 Encounter for examination and observation for other specified reasons: Secondary | ICD-10-CM

## 2012-03-18 DIAGNOSIS — Z1389 Encounter for screening for other disorder: Secondary | ICD-10-CM | POA: Insufficient documentation

## 2012-03-18 DIAGNOSIS — O358XX0 Maternal care for other (suspected) fetal abnormality and damage, not applicable or unspecified: Secondary | ICD-10-CM | POA: Insufficient documentation

## 2012-03-18 DIAGNOSIS — Z363 Encounter for antenatal screening for malformations: Secondary | ICD-10-CM | POA: Insufficient documentation

## 2012-06-07 ENCOUNTER — Inpatient Hospital Stay (HOSPITAL_COMMUNITY)
Admission: AD | Admit: 2012-06-07 | Discharge: 2012-06-07 | Disposition: A | Discharge status: Home / Self Care | Payer: Medicaid Other | Source: Ambulatory Visit | Attending: Obstetrics and Gynecology | Admitting: Obstetrics and Gynecology

## 2012-06-07 ENCOUNTER — Encounter (HOSPITAL_COMMUNITY): Payer: Self-pay | Admitting: *Deleted

## 2012-06-07 DIAGNOSIS — O479 False labor, unspecified: Secondary | ICD-10-CM | POA: Insufficient documentation

## 2012-06-07 NOTE — Progress Notes (Signed)
RN at the bedside - pt. Sitting in HF position, monitor tracing maternal pulse.  Pt. SF - fetal tracing reappears.

## 2012-06-07 NOTE — Progress Notes (Signed)
RN to the bedside to review discharge instructions with patient, pt. Verbalized understanding, copy given. Spouse at the bedside.

## 2012-06-07 NOTE — MAU Note (Signed)
Reports ctx on and off all day. Denies SROM or bleeding and reports good fetal movment

## 2012-06-08 NOTE — Progress Notes (Signed)
FHT from 11-17 reviewed.  Reactive NST, no significant decels, irreg ctx.

## 2013-04-12 ENCOUNTER — Encounter (HOSPITAL_COMMUNITY): Payer: Self-pay | Admitting: *Deleted

## 2013-11-12 ENCOUNTER — Encounter (HOSPITAL_COMMUNITY): Payer: Self-pay | Admitting: Emergency Medicine

## 2013-11-12 ENCOUNTER — Emergency Department (HOSPITAL_COMMUNITY)
Admission: EM | Admit: 2013-11-12 | Discharge: 2013-11-12 | Disposition: A | Discharge status: Home / Self Care | Payer: Medicaid Other | Attending: Emergency Medicine | Admitting: Emergency Medicine

## 2013-11-12 DIAGNOSIS — T07XXXA Unspecified multiple injuries, initial encounter: Secondary | ICD-10-CM

## 2013-11-12 DIAGNOSIS — Z79899 Other long term (current) drug therapy: Secondary | ICD-10-CM | POA: Insufficient documentation

## 2013-11-12 DIAGNOSIS — Y9389 Activity, other specified: Secondary | ICD-10-CM | POA: Insufficient documentation

## 2013-11-12 DIAGNOSIS — J45909 Unspecified asthma, uncomplicated: Secondary | ICD-10-CM | POA: Insufficient documentation

## 2013-11-12 DIAGNOSIS — Z880 Allergy status to penicillin: Secondary | ICD-10-CM | POA: Insufficient documentation

## 2013-11-12 DIAGNOSIS — Y92410 Unspecified street and highway as the place of occurrence of the external cause: Secondary | ICD-10-CM | POA: Insufficient documentation

## 2013-11-12 DIAGNOSIS — S4980XA Other specified injuries of shoulder and upper arm, unspecified arm, initial encounter: Secondary | ICD-10-CM | POA: Insufficient documentation

## 2013-11-12 DIAGNOSIS — F17200 Nicotine dependence, unspecified, uncomplicated: Secondary | ICD-10-CM | POA: Insufficient documentation

## 2013-11-12 DIAGNOSIS — S20219A Contusion of unspecified front wall of thorax, initial encounter: Secondary | ICD-10-CM | POA: Insufficient documentation

## 2013-11-12 DIAGNOSIS — S46909A Unspecified injury of unspecified muscle, fascia and tendon at shoulder and upper arm level, unspecified arm, initial encounter: Secondary | ICD-10-CM | POA: Insufficient documentation

## 2013-11-12 DIAGNOSIS — IMO0002 Reserved for concepts with insufficient information to code with codable children: Secondary | ICD-10-CM | POA: Insufficient documentation
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
## 2013-11-12 NOTE — Discharge Instructions (Signed)
Contusion °A contusion is a deep bruise. Contusions are the result of an injury that caused bleeding under the skin. The contusion may turn blue, purple, or yellow. Minor injuries will give you a painless contusion, but more severe contusions may stay painful and swollen for a few weeks.  °CAUSES  °A contusion is usually caused by a blow, trauma, or direct force to an area of the body. °SYMPTOMS  °· Swelling and redness of the injured area. °· Bruising of the injured area. °· Tenderness and soreness of the injured area. °· Pain. °DIAGNOSIS  °The diagnosis can be made by taking a history and physical exam. An X-ray, CT scan, or MRI may be needed to determine if there were any associated injuries, such as fractures. °TREATMENT  °Specific treatment will depend on what area of the body was injured. In general, the best treatment for a contusion is resting, icing, elevating, and applying cold compresses to the injured area. Over-the-counter medicines may also be recommended for pain control. Ask your caregiver what the best treatment is for your contusion. °HOME CARE INSTRUCTIONS  °· Put ice on the injured area. °· Put ice in a plastic bag. °· Place a towel between your skin and the bag. °· Leave the ice on for 15-20 minutes, 03-04 times a day. °· Only take over-the-counter or prescription medicines for pain, discomfort, or fever as directed by your caregiver. Your caregiver may recommend avoiding anti-inflammatory medicines (aspirin, ibuprofen, and naproxen) for 48 hours because these medicines may increase bruising. °· Rest the injured area. °· If possible, elevate the injured area to reduce swelling. °SEEK IMMEDIATE MEDICAL CARE IF:  °· You have increased bruising or swelling. °· You have pain that is getting worse. °· Your swelling or pain is not relieved with medicines. °MAKE SURE YOU:  °· Understand these instructions. °· Will watch your condition. °· Will get help right away if you are not doing well or get  worse. °Document Released: 04/17/2005 Document Revised: 09/30/2011 Document Reviewed: 05/13/2011 °[employer]® Patient Information ©2014 [employer]. °Motor Vehicle Collision  °It is common to have multiple bruises and sore muscles after a motor vehicle collision (MVC). These tend to feel worse for the first 24 hours. You may have the most stiffness and soreness over the first several hours. You may also feel worse when you wake up the first morning after your collision. After this point, you will usually begin to improve with each day. The speed of improvement often depends on the severity of the collision, the number of injuries, and the location and nature of these injuries. °HOME CARE INSTRUCTIONS  °· Put ice on the injured area. °· Put ice in a plastic bag. °· Place a towel between your skin and the bag. °· Leave the ice on for 15-20 minutes, 03-04 times a day. °· Drink enough fluids to keep your urine clear or pale yellow. Do not drink alcohol. °· Take a warm shower or bath once or twice a day. This will increase blood flow to sore muscles. °· You may return to activities as directed by your caregiver. Be careful when lifting, as this may aggravate neck or back pain. °· Only take over-the-counter or prescription medicines for pain, discomfort, or fever as directed by your caregiver. Do not use aspirin. This may increase bruising and bleeding. °SEEK IMMEDIATE MEDICAL CARE IF: °· You have numbness, tingling, or weakness in the arms or legs. °· You develop severe headaches not relieved with medicine. °· You have   severe neck pain, especially tenderness in the middle of the back of your neck. °· You have changes in bowel or bladder control. °· There is increasing pain in any area of the body. °· You have shortness of breath, lightheadedness, dizziness, or fainting. °· You have chest pain. °· You feel sick to your stomach (nauseous), throw up (vomit), or sweat. °· You have increasing abdominal discomfort. °· There is  blood in your urine, stool, or vomit. °· You have pain in your shoulder (shoulder strap areas). °· You feel your symptoms are getting worse. °MAKE SURE YOU:  °· Understand these instructions. °· Will watch your condition. °· Will get help right away if you are not doing well or get worse. °Document Released: 07/08/2005 Document Revised: 09/30/2011 Document Reviewed: 12/05/2010 °[employer]® Patient Information ©2014 [employer]. ° °

## 2014-05-23 ENCOUNTER — Encounter (HOSPITAL_COMMUNITY): Payer: Self-pay | Admitting: Emergency Medicine
